# Patient Record
Sex: Female | Born: 1970 | Race: Black or African American | Hispanic: No | Marital: Single | Smoking: Never smoker
Health system: Southern US, Community
[De-identification: ages and names within clinical notes are randomized; demographics above are authoritative.]

---

## 2015-05-12 ENCOUNTER — Emergency Department (INDEPENDENT_AMBULATORY_CARE_PROVIDER_SITE_OTHER): Payer: Self-pay

## 2015-05-12 ENCOUNTER — Encounter (HOSPITAL_COMMUNITY): Payer: Self-pay | Admitting: *Deleted

## 2015-05-12 ENCOUNTER — Emergency Department (INDEPENDENT_AMBULATORY_CARE_PROVIDER_SITE_OTHER)
Admission: EM | Admit: 2015-05-12 | Discharge: 2015-05-12 | Disposition: A | Discharge status: Home / Self Care | Payer: Self-pay | Source: Home / Self Care | Attending: Family Medicine | Admitting: Family Medicine

## 2015-05-12 DIAGNOSIS — R600 Localized edema: Secondary | ICD-10-CM

## 2015-05-12 LAB — POCT I-STAT, CHEM 8
BUN: 13 mg/dL (ref 6–20)
Calcium, Ion: 1.21 mmol/L (ref 1.12–1.23)
Chloride: 102 mmol/L (ref 101–111)
Creatinine, Ser: 0.8 mg/dL (ref 0.44–1.00)
GLUCOSE: 102 mg/dL — AB (ref 65–99)
HCT: 37 % (ref 36.0–46.0)
Hemoglobin: 12.6 g/dL (ref 12.0–15.0)
POTASSIUM: 4 mmol/L (ref 3.5–5.1)
Sodium: 142 mmol/L (ref 135–145)
TCO2: 26 mmol/L (ref 0–100)

## 2015-05-12 MED ORDER — HYDROCHLOROTHIAZIDE 25 MG PO TABS: 25.0000 mg | ORAL_TABLET | Freq: Every day | ORAL | Status: AC

## 2015-05-12 NOTE — ED Notes (Signed)
Pt reports   Symptoms  Of       swelling in both  Feet  And  Lower extremities      According  To  Aid  Worker        Pt           Has  Had  Ongoing symptoms             X  15  Years

## 2015-05-12 NOTE — Discharge Instructions (Signed)
See your doctor if further problems. °

## 2015-05-12 NOTE — ED Provider Notes (Signed)
CSN: [id number]     Arrival date & time 05/12/15  1356 History   None    Chief Complaint  Patient presents with  . Leg Swelling   (Consider location/radiation/quality/duration/timing/severity/associated sxs/prior Treatment) Patient is a 44 y.o. female presenting with leg pain. The history is provided by the patient. The history is limited by a language barrier. A language interpreter was used.  Leg Pain Location:  Leg Leg location:  L lower leg and R lower leg Pain details:    Radiates to:  Does not radiate   Severity:  Moderate   Onset quality:  Gradual   Duration:  180 months   Progression:  Unchanged Chronicity:  Chronic Prior injury to area:  No Relieved by:  Nothing Worsened by:  Nothing tried Ineffective treatments:  None tried Associated symptoms: swelling     History reviewed. No pertinent past medical history. History reviewed. No pertinent past surgical history. History reviewed. No pertinent family history. History  Substance Use Topics  . Smoking status: Never Smoker   . Smokeless tobacco: Not on file  . Alcohol Use: No   OB History    No data available     Review of Systems  Constitutional: Negative.   Respiratory: Negative for shortness of breath.   Cardiovascular: Positive for leg swelling. Negative for chest pain.    Allergies  Review of patient's allergies indicates not on file.  Home Medications   Prior to Admission medications   Medication Sig Start Date End Date Taking? Authorizing Provider  hydrochlorothiazide (HYDRODIURIL) 25 MG tablet Take 1 tablet (25 mg total) by mouth daily. For swelling of  legs 05/12/15   Linna Hoff, MD   BP 151/90 mmHg  Pulse 71  Temp(Src) 98 F (36.7 C) (Oral)  Resp 16  SpO2 100%  LMP  (LMP Unknown) Physical Exam  Constitutional: She is oriented to person, place, and time. She appears well-developed and well-nourished. No distress.  Cardiovascular: Regular rhythm, normal heart sounds and intact distal  pulses.   Pulmonary/Chest: Effort normal and breath sounds normal.  Neurological: She is alert and oriented to person, place, and time. She has normal reflexes.  Skin: Skin is warm and dry.  Nursing note and vitals reviewed.   ED Course  Procedures (including critical care time) Labs Review Labs Reviewed  POCT I-STAT, CHEM 8 - Abnormal; Notable for the following:    Glucose, Bld 102 (*)    All other components within normal limits    Imaging Review Dg Chest 2 View  05/12/2015   CLINICAL DATA:  Bilateral lower extremity swelling, recent arrival to [place]  EXAM: CHEST - 2 VIEW  COMPARISON:  None.  FINDINGS: The heart size and mediastinal contours are within normal limits. Both lungs are clear. The visualized skeletal structures are unremarkable.  IMPRESSION: No active disease.   Electronically Signed   By: Alcide Clever M.D.   On: 05/12/2015 16:27    X-rays reviewed and report per radiologist.  MDM      Linna Hoff, MD 05/12/15 2118

## 2015-07-29 ENCOUNTER — Ambulatory Visit
Admission: RE | Admit: 2015-07-29 | Discharge: 2015-07-29 | Disposition: A | Discharge status: Home / Self Care | Payer: No Typology Code available for payment source | Source: Ambulatory Visit | Attending: Infectious Disease | Admitting: Infectious Disease

## 2015-07-29 ENCOUNTER — Other Ambulatory Visit: Payer: Self-pay | Admitting: Infectious Disease

## 2015-07-29 DIAGNOSIS — R7612 Nonspecific reaction to cell mediated immunity measurement of gamma interferon antigen response without active tuberculosis: Secondary | ICD-10-CM

## 2017-01-26 IMAGING — CR DG CHEST 1V
1 series · 1 of 1 positions shown · non-contrast
Comparison: 05/12/2015.

CLINICAL DATA: Positive TB test.

EXAM:
CHEST 1 VIEW

[w chest pa]
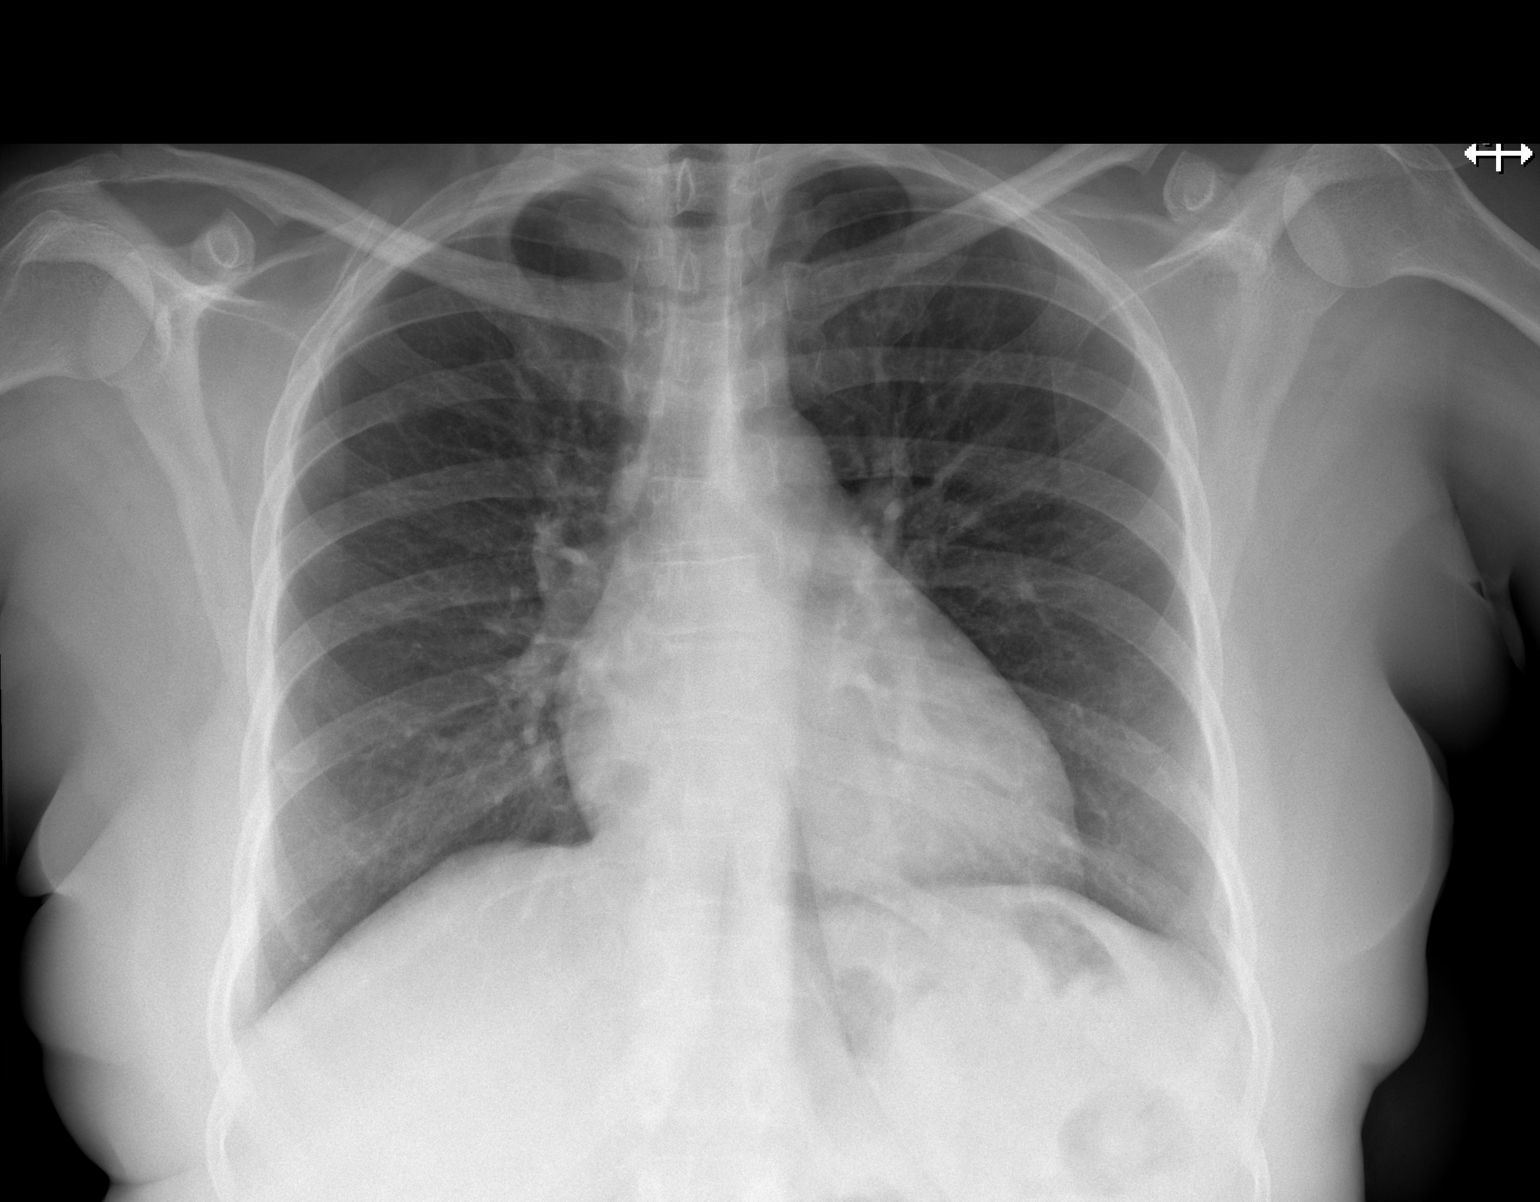

[1 of 1 positions shown; findings below may reference images not displayed]

FINDINGS: Trachea is midline. Heart size normal. Lungs are clear. No pleural
fluid.
IMPRESSION: Negative.

## 2024-01-22 ENCOUNTER — Emergency Department (HOSPITAL_COMMUNITY)
Admission: EM | Admit: 2024-01-22 | Discharge: 2024-01-22 | Disposition: A | Discharge status: Home / Self Care | Attending: Emergency Medicine | Admitting: Emergency Medicine

## 2024-01-22 ENCOUNTER — Emergency Department (HOSPITAL_COMMUNITY)

## 2024-01-22 ENCOUNTER — Other Ambulatory Visit: Payer: Self-pay

## 2024-01-22 ENCOUNTER — Encounter (HOSPITAL_COMMUNITY): Payer: Self-pay

## 2024-01-22 DIAGNOSIS — S0003XA Contusion of scalp, initial encounter: Secondary | ICD-10-CM | POA: Insufficient documentation

## 2024-01-22 DIAGNOSIS — Y92410 Unspecified street and highway as the place of occurrence of the external cause: Secondary | ICD-10-CM | POA: Diagnosis not present

## 2024-01-22 DIAGNOSIS — Z79899 Other long term (current) drug therapy: Secondary | ICD-10-CM | POA: Diagnosis not present

## 2024-01-22 DIAGNOSIS — S0990XA Unspecified injury of head, initial encounter: Secondary | ICD-10-CM | POA: Diagnosis present

## 2024-01-22 DIAGNOSIS — I10 Essential (primary) hypertension: Secondary | ICD-10-CM | POA: Insufficient documentation

## 2024-01-22 MED ORDER — OXYCODONE-ACETAMINOPHEN 5-325 MG PO TABS
2.0000 | ORAL_TABLET | Freq: Once | ORAL | Status: AC
  Administered 2024-01-22: 2 via ORAL
  Filled 2024-01-22: qty 2
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
